# Patient Record
Sex: Male | Born: 2000 | Hispanic: No | Marital: Single | Smoking: Never smoker
Health system: Southern US, Community
[De-identification: ages and names within clinical notes are randomized; demographics above are authoritative.]

---
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
## 2022-11-14 ENCOUNTER — Emergency Department (HOSPITAL_COMMUNITY)
Admission: EM | Admit: 2022-11-14 | Discharge: 2022-11-14 | Disposition: A | Discharge status: Home / Self Care | Payer: Medicaid Other | Source: Home / Self Care
